# Patient Record
Sex: Male | Born: 1960 | Race: Black or African American | Hispanic: No | Marital: Single | Smoking: Current some day smoker
Health system: Southern US, Community
[De-identification: ages and names within clinical notes are randomized; demographics above are authoritative.]

---

## 2007-09-22 ENCOUNTER — Emergency Department (HOSPITAL_COMMUNITY): Admission: EM | Admit: 2007-09-22 | Discharge: 2007-09-22 | Payer: Self-pay | Admitting: Emergency Medicine

## 2009-10-24 IMAGING — CR DG RIBS W/ CHEST 3+V*R*
4 series · 4 of 4 positions shown · non-contrast
Comparison: No priors

CLINICAL DATA: Fell - right sided rib pain with shortness of breath

RIGHT RIBS AND CHEST - 3+ VIEW

[view not recorded (1 of 4)]
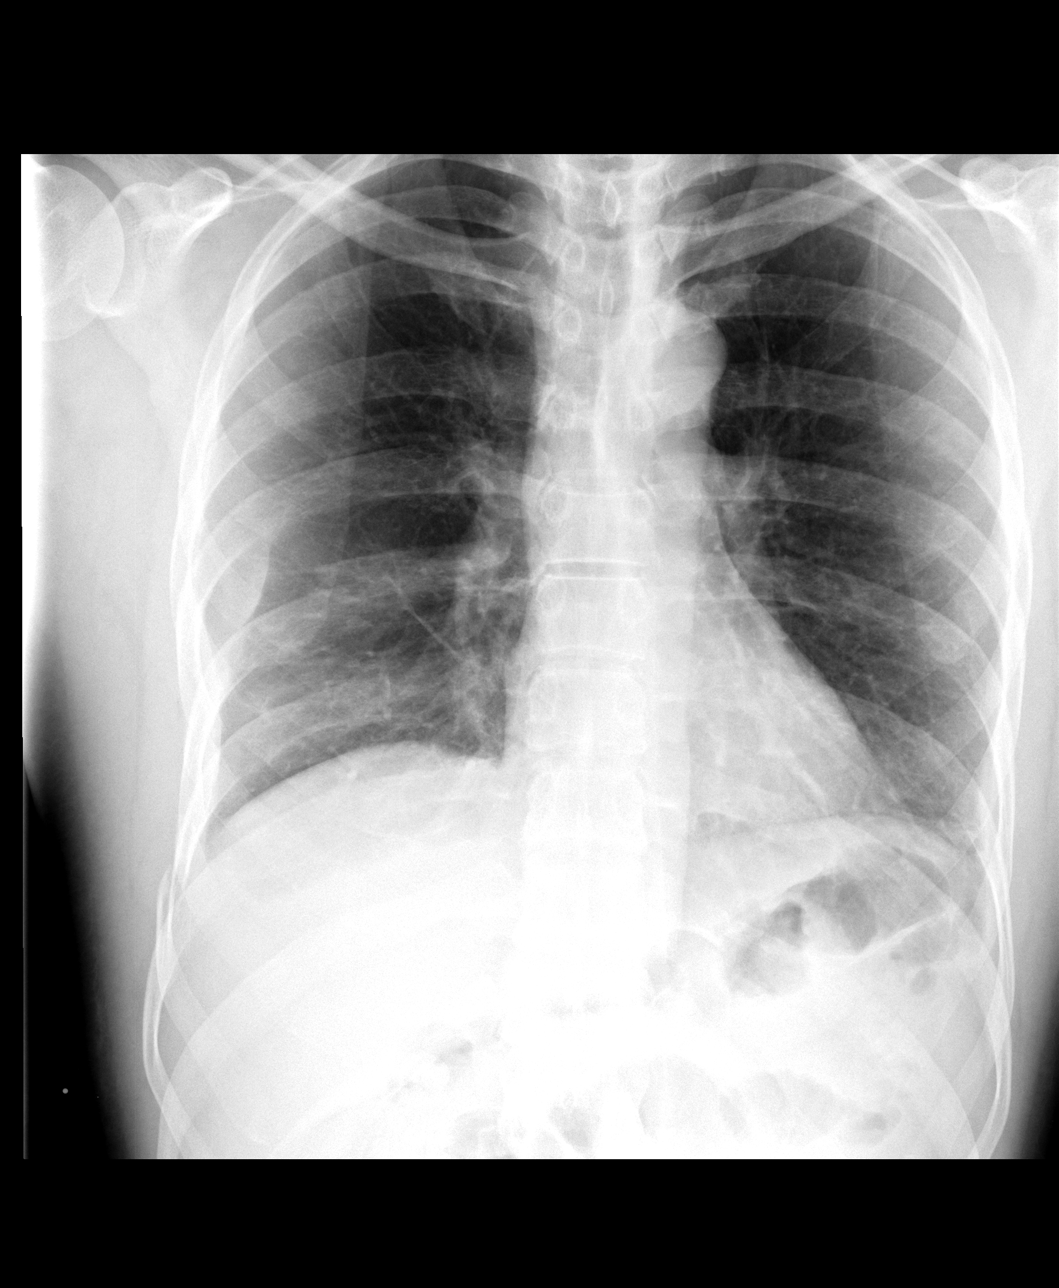

[view not recorded (2 of 4)]
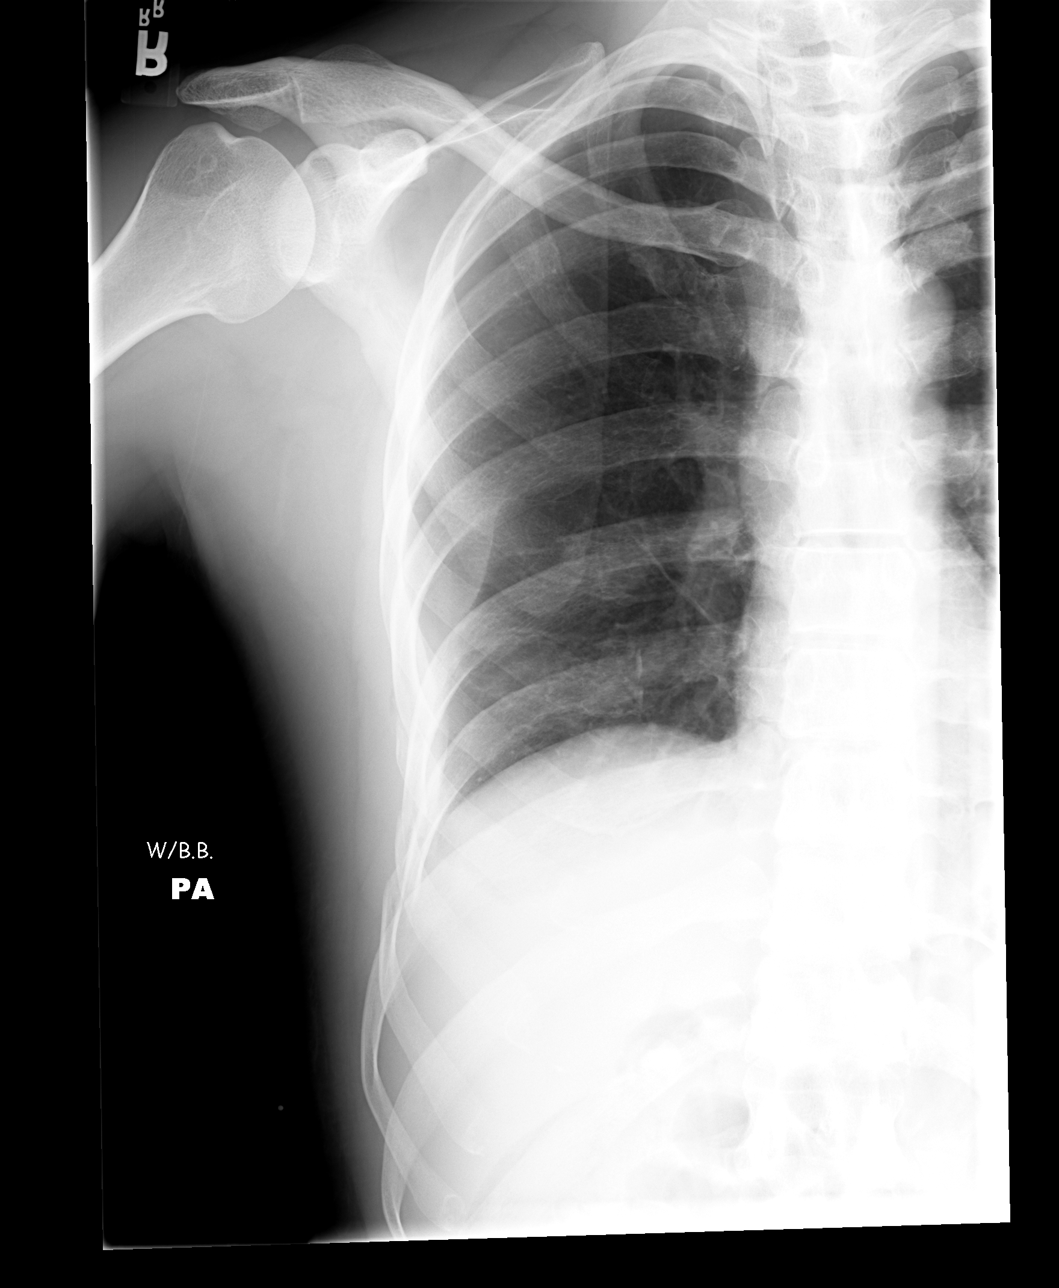

[view not recorded (3 of 4)]
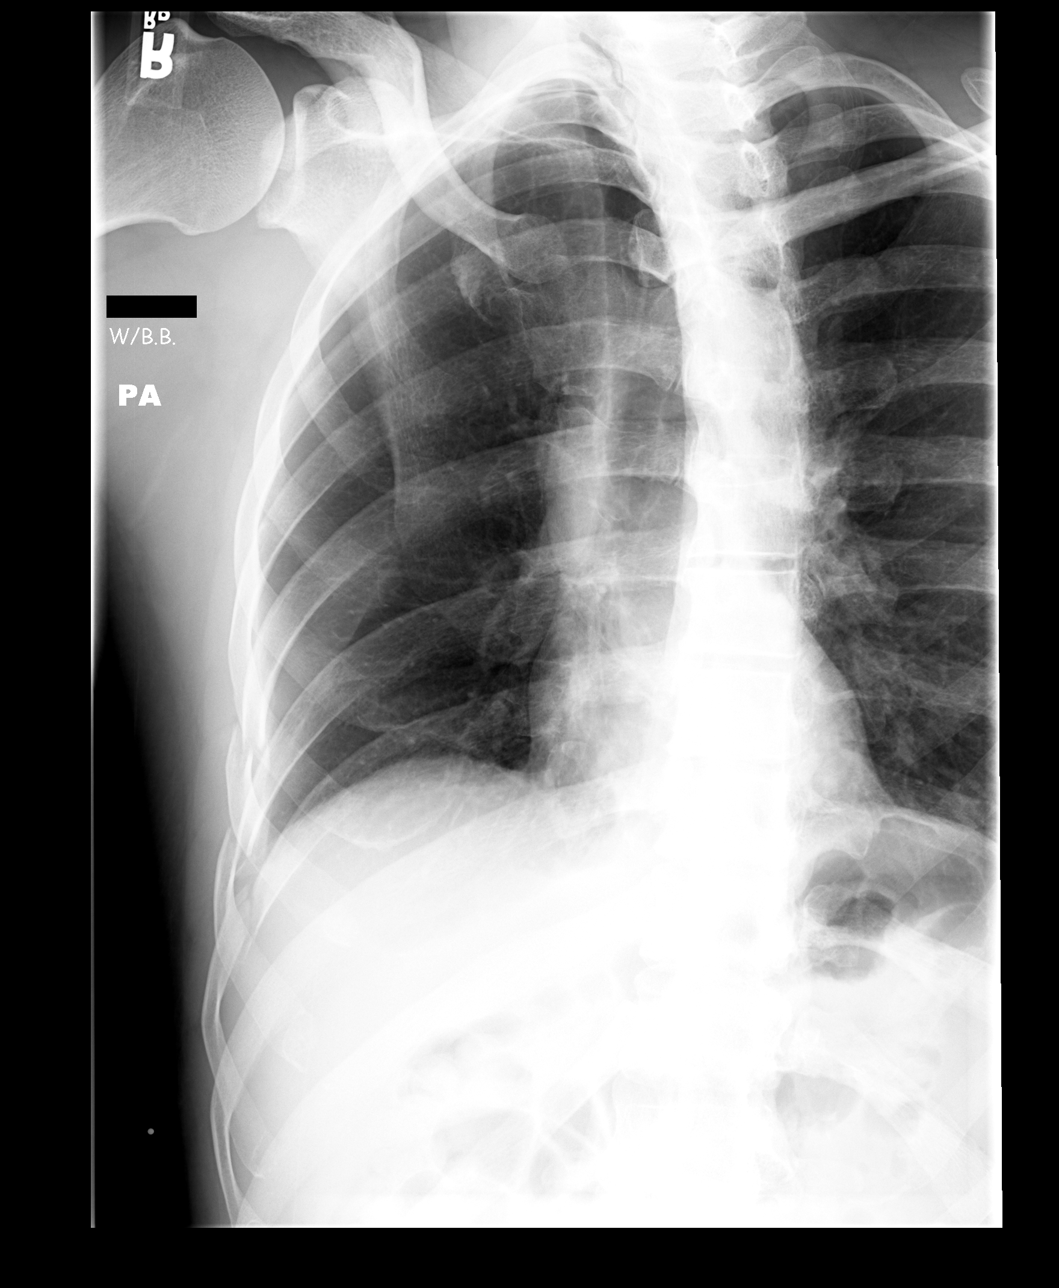

[view not recorded (4 of 4)]
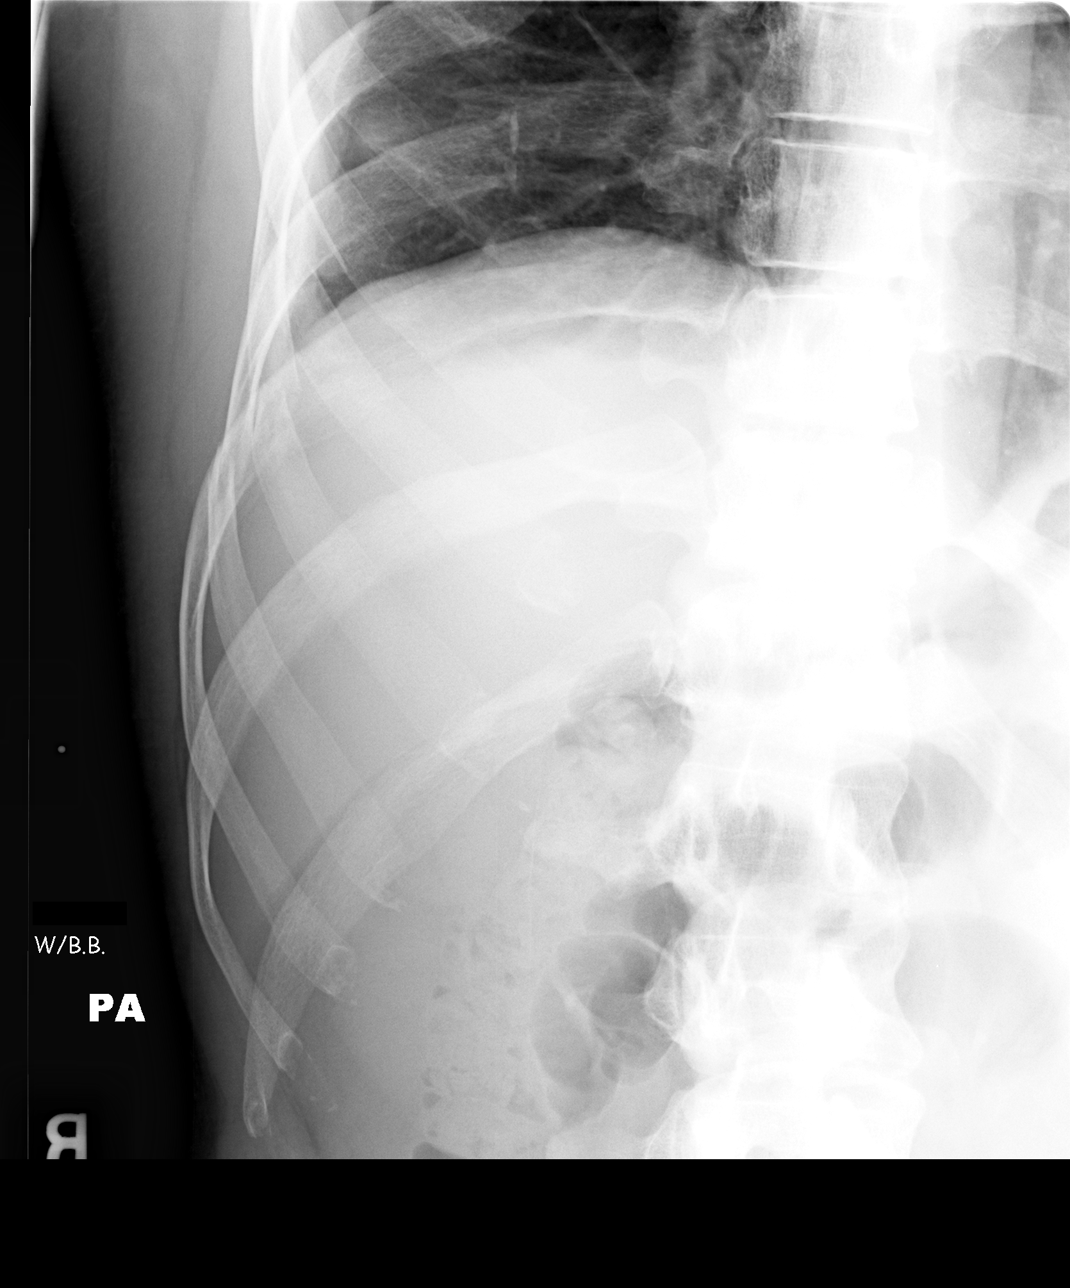

[4 of 4 positions shown; findings below may reference images not displayed]

FINDINGS: There are fractures of the right sixth seventh and eighth
ribs.  This time there is no definite pneumothorax or hemothorax.
Heart mediastinal contours normal.
IMPRESSION: Acute fractures of the right sixth through eighth ribs with no
pneumothorax, hemothorax, or active cardiopulmonary disease.

## 2014-08-14 ENCOUNTER — Encounter: Payer: Self-pay | Admitting: Emergency Medicine

## 2014-08-14 ENCOUNTER — Emergency Department
Admission: EM | Admit: 2014-08-14 | Discharge: 2014-08-14 | Disposition: A | Discharge status: Home / Self Care | Payer: Self-pay | Attending: Emergency Medicine | Admitting: Emergency Medicine

## 2014-08-14 ENCOUNTER — Emergency Department: Payer: Self-pay

## 2014-08-14 DIAGNOSIS — S61411A Laceration without foreign body of right hand, initial encounter: Secondary | ICD-10-CM | POA: Insufficient documentation

## 2014-08-14 DIAGNOSIS — W293XXA Contact with powered garden and outdoor hand tools and machinery, initial encounter: Secondary | ICD-10-CM | POA: Insufficient documentation

## 2014-08-14 DIAGNOSIS — Y9289 Other specified places as the place of occurrence of the external cause: Secondary | ICD-10-CM | POA: Insufficient documentation

## 2014-08-14 DIAGNOSIS — Z720 Tobacco use: Secondary | ICD-10-CM | POA: Insufficient documentation

## 2014-08-14 DIAGNOSIS — Y998 Other external cause status: Secondary | ICD-10-CM | POA: Insufficient documentation

## 2014-08-14 DIAGNOSIS — Y9389 Activity, other specified: Secondary | ICD-10-CM | POA: Insufficient documentation

## 2014-08-14 MED ORDER — LIDOCAINE HCL (PF) 1 % IJ SOLN
10.0000 mL | Freq: Once | INTRAMUSCULAR | Status: DC
  Filled 2014-08-14: qty 10

## 2014-08-14 NOTE — ED Notes (Signed)
Pt with laceration to right hand with chain saw. Urgent care sent pt over for further eval.

## 2014-08-14 NOTE — ED Provider Notes (Signed)
CSN: [id number]     Arrival date & time 08/14/14  1147 History   First MD Initiated Contact with Patient 08/14/14 1212     Chief Complaint  Patient presents with  . Laceration    HPI Comments: 54 year old right hand dominant male presents today complaining of laceration to right palm just prior to arrival. Pt went to an urgent care and they sent him here for further evaluation, he is unsure why. He accidentally cut himself with his chainsaw while cutting up wood when he tried to answer his cell phone. He does not complain of any pain. He feels sure he had a tetanus shot in the past 10 years.   Patient is a 54 y.o. male presenting with skin laceration. The history is provided by the patient.  Laceration Location:  Hand Hand laceration location:  R palm Length (cm):  4 Depth:  Cutaneous Quality: straight   Bleeding: controlled   Laceration mechanism:  Metal edge Pain details:    Quality:  Aching   Severity:  Mild   Progression:  Unchanged Foreign body present:  No foreign bodies Tetanus status:  Up to date   History reviewed. No pertinent past medical history. History reviewed. No pertinent past surgical history. No family history on file. History  Substance Use Topics  . Smoking status: Current Some Day Smoker  . Smokeless tobacco: Not on file  . Alcohol Use: No    Review of Systems  Musculoskeletal: Negative for myalgias and arthralgias.  Skin: Positive for wound.  All other systems reviewed and are negative.     Allergies  Review of patient's allergies indicates no known allergies.  Home Medications   Prior to Admission medications   Not on File   BP 142/83 mmHg  Pulse 56  Temp(Src) 98.6 F (37 C) (Oral)  Resp 18  Ht 6\' 1"  (1.854 m)  Wt 167 lb (75.751 kg)  BMI 22.04 kg/m2  SpO2 98% Physical Exam  Constitutional: He is oriented to person, place, and time. Vital signs are normal. He appears well-developed and well-nourished.  HENT:  Head: Normocephalic  and atraumatic.  Musculoskeletal: Normal range of motion.       Right hand: He exhibits laceration. He exhibits normal range of motion, normal capillary refill, no deformity and no swelling.       Hands: Full ROM of right 1st digit without restriction   Neurological: He is alert and oriented to person, place, and time.  Skin: Skin is warm and dry.  Psychiatric: He has a normal mood and affect. His behavior is normal. Judgment and thought content normal.  Nursing note and vitals reviewed.   ED Course  LACERATION REPAIR Date/Time: 08/14/2014 1:56 PM Performed by: Luvenia ReddenWEAVIL, Kareemah Grounds V Authorized by: Luvenia ReddenWEAVIL, Alisi Lupien V Consent: Verbal consent obtained. Written consent obtained. Risks and benefits: risks, benefits and alternatives were discussed Consent given by: patient Patient understanding: patient states understanding of the procedure being performed Patient consent: the patient's understanding of the procedure matches consent given Procedure consent: procedure consent matches procedure scheduled Relevant documents: relevant documents present and verified Test results: test results available and properly labeled Site marked: the operative site was marked Imaging studies: imaging studies available Required items: required blood products, implants, devices, and special equipment available Patient identity confirmed: verbally with patient Time out: Immediately prior to procedure a "time out" was called to verify the correct patient, procedure, equipment, support staff and site/side marked as required. Body area: upper extremity Location details: right  hand Laceration length: 5 cm Contamination: The wound is contaminated. Foreign bodies: no foreign bodies Tendon involvement: none Nerve involvement: none Vascular damage: no Anesthesia: local infiltration Local anesthetic: lidocaine 1% without epinephrine Anesthetic total: 10 ml Patient sedated: no Preparation: Patient was prepped and draped  in the usual sterile fashion. Irrigation solution: saline Irrigation method: syringe Amount of cleaning: extensive Debridement: none Degree of undermining: none Skin closure: 4-0 nylon Number of sutures: 8 Technique: simple Approximation: close Approximation difficulty: simple Dressing: 4x4 sterile gauze and gauze roll Patient tolerance: Patient tolerated the procedure well with no immediate complications   (including critical care time) Labs Review Labs Reviewed - No data to display  Imaging Review Dg Hand Complete Right  08/14/2014   CLINICAL DATA:  Laceration of the right hand with chain saw.  EXAM: RIGHT HAND - COMPLETE 3+ VIEW  COMPARISON:  None.  FINDINGS: Dressing material overlies the anterior aspect of the hand, obscuring detail. No acute fracture or traumatic subluxation identified. No radiopaque foreign body.  IMPRESSION: No evidence for acute osseous injury.   Electronically Signed   By: Norva Pavlov M.D.   On: 08/14/2014 12:48     EKG Interpretation None      MDM  I reviewed XRAY, no fracture or radio opaque foreign body. Advised to clean and dress, change dressing daily. Follow up in 7-10 days for suture removal. Return earlier for increase swelling, purulent drainage or redness around wound. Tetanus UTD per patient Final diagnoses:  Hand laceration, right, initial encounter      Luvenia Redden, PA-C 08/14/14 1359  Jene Every, MD 08/14/14 1616

## 2014-08-25 ENCOUNTER — Emergency Department
Admission: EM | Admit: 2014-08-25 | Discharge: 2014-08-25 | Disposition: A | Discharge status: Home / Self Care | Payer: Self-pay | Attending: Emergency Medicine | Admitting: Emergency Medicine

## 2014-08-25 DIAGNOSIS — Z4802 Encounter for removal of sutures: Secondary | ICD-10-CM | POA: Insufficient documentation

## 2014-08-25 DIAGNOSIS — Z720 Tobacco use: Secondary | ICD-10-CM | POA: Insufficient documentation

## 2014-08-25 NOTE — ED Notes (Signed)
Pt here for suture removal of the right hand. 

## 2014-08-25 NOTE — ED Provider Notes (Signed)
[facility] Emergency Department Provider Note  ____________________________________________  Time seen:  2:06 PM  I have reviewed the triage vital signs and the nursing notes.   HISTORY  Chief Complaint Suture / Staple Removal   HPI Bryce Moreno is a 54 y.o. male returns for suture removal of his right hand. Patient was here on 7/16 for a laceration to his right hand. He states he has not had any problems with this and is unaware of any fever.Currently he denies any pain.   No past medical history on file.  There are no active problems to display for this patient.   No past surgical history on file.  No current outpatient prescriptions on file.  Allergies Review of patient's allergies indicates no known allergies.  No family history on file.  Social History History  Substance Use Topics  . Smoking status: Current Some Day Smoker  . Smokeless tobacco: Not on file  . Alcohol Use: No    Review of Systems Constitutional: No fever/chills Skin: Negative for rash. Neurological: Negative for headaches  10-point ROS otherwise negative.  ____________________________________________   PHYSICAL EXAM:  VITAL SIGNS: ED Triage Vitals  Enc Vitals Group     BP 08/25/14 1323 146/82 mmHg     Pulse Rate 08/25/14 1323 66     Resp 08/25/14 1323 16     Temp 08/25/14 1323 98.2 F (36.8 C)     Temp Source 08/25/14 1323 Oral     SpO2 08/25/14 1323 99 %     Weight 08/25/14 1323 155 lb (70.308 kg)     Height 08/25/14 1323  (1.854 m)     Head Cir --      Peak Flow --      Pain Score --      Pain Loc --      Pain Edu? --      Excl. in GC? --     Constitutional: Alert and oriented. Well appearing and in no acute distress. Eyes: Conjunctivae are normal. PERRL. EOMI. Head: Atraumatic. Nose: No congestion/rhinnorhea. Neck: No stridor Respiratory: Normal respiratory effort.  No retractions Musculoskeletal: Moves extremities well Neurologic:   Normal speech and language. No gross focal neurologic deficits are appreciated. No gait instability. Skin:  Skin is warm, dry and intact. No rash noted. No infection noted at suture site Psychiatric: Mood and affect are normal. Speech and behavior are normal.  ____________________________________________   LABS (all labs ordered are listed, but only abnormal results are displayed)  Labs Reviewed - No data to display  PROCEDURES  Procedure(s) performed: None  Critical Care performed: No  ____________________________________________   INITIAL IMPRESSION / ASSESSMENT AND PLAN / ED COURSE  Pertinent labs & imaging results that were available during my care of the patient were reviewed by me and considered in my medical decision making (see chart for details).  Patient was told to keep clean and dry and watch for signs of infection sutures were removal by RN ____________________________________________   FINAL CLINICAL IMPRESSION(S) / ED DIAGNOSES  Final diagnoses:  Encounter for removal of sutures      Tommi Rumps, PA-C 08/25/14 1537  Minna Antis, MD 08/26/14 1302

## 2014-08-25 NOTE — ED Notes (Signed)
Pt seen and assessed by provider, see provider note for full details 

## 2014-08-25 NOTE — Discharge Instructions (Signed)
